# Patient Record
Sex: Female | Born: 1950 | Race: Black or African American | Hispanic: No | Smoking: Never smoker
Health system: Southern US, Community
[De-identification: ages and names within clinical notes are randomized; demographics above are authoritative.]

## PROBLEM LIST (undated history)

## (undated) DIAGNOSIS — Z1231 Encounter for screening mammogram for malignant neoplasm of breast: Secondary | ICD-10-CM

## (undated) DIAGNOSIS — I10 Essential (primary) hypertension: Secondary | ICD-10-CM

## (undated) DIAGNOSIS — Z01812 Encounter for preprocedural laboratory examination: Secondary | ICD-10-CM

## (undated) DIAGNOSIS — Z1211 Encounter for screening for malignant neoplasm of colon: Secondary | ICD-10-CM

## (undated) HISTORY — PX: ABDOMINAL HYSTERECTOMY: SHX81

---

## 1998-05-09 ENCOUNTER — Ambulatory Visit (HOSPITAL_BASED_OUTPATIENT_CLINIC_OR_DEPARTMENT_OTHER): Admission: RE | Admit: 1998-05-09 | Discharge: 1998-05-09 | Payer: Self-pay | Admitting: Orthopaedic Surgery

## 1998-10-05 ENCOUNTER — Encounter: Payer: Self-pay | Admitting: *Deleted

## 1998-10-05 ENCOUNTER — Ambulatory Visit (HOSPITAL_COMMUNITY): Admission: RE | Admit: 1998-10-05 | Discharge: 1998-10-05 | Payer: Self-pay | Admitting: *Deleted

## 1998-10-19 ENCOUNTER — Encounter: Payer: Self-pay | Admitting: *Deleted

## 1998-10-19 ENCOUNTER — Ambulatory Visit (HOSPITAL_COMMUNITY): Admission: RE | Admit: 1998-10-19 | Discharge: 1998-10-19 | Payer: Self-pay | Admitting: *Deleted

## 1999-10-16 ENCOUNTER — Ambulatory Visit (HOSPITAL_COMMUNITY): Admission: RE | Admit: 1999-10-16 | Discharge: 1999-10-16 | Payer: Self-pay | Admitting: *Deleted

## 2000-10-20 ENCOUNTER — Encounter: Payer: Self-pay | Admitting: Obstetrics and Gynecology

## 2000-10-20 ENCOUNTER — Ambulatory Visit (HOSPITAL_COMMUNITY): Admission: RE | Admit: 2000-10-20 | Discharge: 2000-10-20 | Payer: Self-pay | Admitting: Obstetrics and Gynecology

## 2000-10-22 ENCOUNTER — Encounter: Payer: Self-pay | Admitting: Obstetrics and Gynecology

## 2000-10-22 ENCOUNTER — Encounter: Admission: RE | Admit: 2000-10-22 | Discharge: 2000-10-22 | Payer: Self-pay | Admitting: Obstetrics and Gynecology

## 2002-01-13 ENCOUNTER — Ambulatory Visit (HOSPITAL_COMMUNITY): Admission: RE | Admit: 2002-01-13 | Discharge: 2002-01-13 | Payer: Self-pay | Admitting: Obstetrics and Gynecology

## 2002-01-13 ENCOUNTER — Encounter: Payer: Self-pay | Admitting: Obstetrics and Gynecology

## 2002-01-19 ENCOUNTER — Encounter: Admission: RE | Admit: 2002-01-19 | Discharge: 2002-01-19 | Payer: Self-pay | Admitting: Obstetrics and Gynecology

## 2002-01-19 ENCOUNTER — Encounter: Payer: Self-pay | Admitting: Obstetrics and Gynecology

## 2003-05-06 ENCOUNTER — Ambulatory Visit (HOSPITAL_COMMUNITY): Admission: RE | Admit: 2003-05-06 | Discharge: 2003-05-06 | Payer: Self-pay | Admitting: Family Medicine

## 2003-07-27 ENCOUNTER — Encounter: Admission: RE | Admit: 2003-07-27 | Discharge: 2003-07-27 | Payer: Self-pay | Admitting: Obstetrics and Gynecology

## 2004-04-18 ENCOUNTER — Encounter: Admission: RE | Admit: 2004-04-18 | Discharge: 2004-04-18 | Payer: Self-pay | Admitting: Obstetrics and Gynecology

## 2005-03-27 ENCOUNTER — Encounter: Admission: RE | Admit: 2005-03-27 | Discharge: 2005-03-27 | Payer: Self-pay | Admitting: Obstetrics and Gynecology

## 2005-11-08 ENCOUNTER — Emergency Department (HOSPITAL_COMMUNITY): Admission: EM | Admit: 2005-11-08 | Discharge: 2005-11-08 | Payer: Self-pay | Admitting: Family Medicine

## 2005-11-10 ENCOUNTER — Emergency Department (HOSPITAL_COMMUNITY): Admission: EM | Admit: 2005-11-10 | Discharge: 2005-11-10 | Payer: Self-pay | Admitting: Family Medicine

## 2005-11-11 ENCOUNTER — Emergency Department (HOSPITAL_COMMUNITY): Admission: EM | Admit: 2005-11-11 | Discharge: 2005-11-11 | Payer: Self-pay | Admitting: Emergency Medicine

## 2006-01-16 ENCOUNTER — Emergency Department (HOSPITAL_COMMUNITY): Admission: EM | Admit: 2006-01-16 | Discharge: 2006-01-17 | Payer: Self-pay | Admitting: Emergency Medicine

## 2006-01-21 ENCOUNTER — Ambulatory Visit (HOSPITAL_COMMUNITY): Admission: RE | Admit: 2006-01-21 | Discharge: 2006-01-22 | Payer: Self-pay | Admitting: Orthopaedic Surgery

## 2007-10-06 ENCOUNTER — Encounter: Admission: RE | Admit: 2007-10-06 | Discharge: 2007-10-06 | Payer: Self-pay | Admitting: Obstetrics

## 2009-08-18 ENCOUNTER — Encounter: Admission: RE | Admit: 2009-08-18 | Discharge: 2009-08-18 | Payer: Self-pay | Admitting: Anesthesiology

## 2010-07-22 ENCOUNTER — Encounter: Payer: Self-pay | Admitting: Obstetrics

## 2010-07-22 ENCOUNTER — Encounter: Payer: Self-pay | Admitting: Obstetrics and Gynecology

## 2010-11-16 NOTE — Consult Note (Signed)
Melanie Bryant, Melanie Bryant               ACCOUNT NO.:  [id number]   MEDICAL RECORD NO.:  [id number]          PATIENT TYPE:  EMS   LOCATION:  ED                           FACILITY:  [facility]   PHYSICIAN:  Vanita Panda. Magnus Ivan, M.D.DATE OF BIRTH:  1951/02/13   DATE OF CONSULTATION:  01/17/2006  DATE OF DISCHARGE:  01/17/2006                                   CONSULTATION   ORTHOPEDIC CONSULTATION   DATE OF CONSULTATION:  January 17, 2006   REASON FOR CONSULTATION:  Displaced right distal radius fracture.   HISTORY OF PRESENT ILLNESS:  Briefly, Ms. Melanie Bryant is a very pleasant 60-year-  old right hand dominant female who said she was coming down some stairs  wearing her high heels when she accidentally missed the last step and fell  on her outstretched right wrist.  She reported intense pain and deformity of  that wrist and was brought to the ER by a friend.  In the ER she was found  to have a severely displaced distal radius fracture and orthopedic surgery  was consulted.  When I saw her she did report numbness and tingling in her  index and middle fingers that was mild and denied any other injury.  She  denied shoulder pain or elbow pain.  She otherwise reported that she had  intense pain in that wrist only.   PAST MEDICAL HISTORY:  Negative.   ALLERGIES:  No known drug allergies.   MEDICATIONS:  None.   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  She does report that she does not see a  physician regularly and has not seen a physician in some time.   REVIEW OF SYSTEMS:  Negative for chest pain, shortness of breath, fever,  chills, nausea or vomiting.  Negative for syncopal episode.   PHYSICAL EXAMINATION:  VITAL SIGNS:  Her blood pressure is 179/96, heart  rate 70, respiratory rate 16, temperature 98.  GENERAL APPEARANCE:  This is an alert and oriented female in no acute  distress but obvious discomfort.  HEENT:   Normocephalic, atraumatic.  Pupils equal, round, reactive to light.  Extraocular muscles intact.  NECK:  Supple.  She is not tender to palpation throughout her cervical spine  and her thoracic and lumbar spines.  PELVIS:  Stable to AP and lateral compression.  EXTREMITIES:  Extremities of her left upper extremity in its' entirety as  well as her bilateral lower extremities showed no obvious deformities except  for some slight pain in her left ankle.  There is no obvious deformity or  swelling at the ankle and it is stable with motion and compression and  stressing.  There is no palpable deficits in the ankle whatsoever.  Examination of the right upper extremity shows no tenderness to palpation at  the shoulder or elbow.  The wrist shows obvious deformity with likely  dorsally displaced fracture.  The skin is intact.  There is abundant  swelling.  She moves her fingers and thumb but these are all quite painful  to her.  She has subjective decreased sensation in the tips of her index and  middle fingers.  Her pulses in the wrist are palpable and the hand appears  well perfused.   CLINICAL DATA:  X-rays are reviewed and show a dorsally displaced, 100%  displaced distal radius fracture involving incongruence of the distal radial  ulnar joint and likely ulnar styloid piece.  This does appear to be extra-  articular fracture though.   IMPRESSION:  This is a 60 year old status post mechanical fall on  outstretched right hand with a severely displaced distal radius fracture.   PLAN:  In the emergency room, I was able to perform a hematoma block on the  dorsum of her wrist, after prepping with Betadine and alcohol using 1% plain  Lidocaine with approximately 5 cc infiltrated and I did draw back hematoma.  I then performed a reduction maneuver and placed her in finger trap traction  with 10 pounds and then in 5 minutes I went up to 15 pounds and then 5 more  minutes up to 20 pounds.  Clinically,  she appeared to be in a reduced  position and she was more comfortable so she was placed in a well-padded  sugar-tong splint.  Post reduction x-rays are pending.  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  I will see her back in the office as the swelling subsides to  make a better determination about the possibility of her surgery.  I have  explained this to her in detail and she is someone who says that she would  not like to be in casting for six weeks and would need to get back to some  type of work as soon as she could so we will discuss these options further  in the office as well.  She is given followup information as well as pain  medication and discharged with friends.           ______________________________  Vanita Panda. Magnus Ivan, M.D.     CYB/MEDQ  D:  01/17/2006  T:  01/17/2006  Job:  [id number]

## 2010-11-16 NOTE — Op Note (Signed)
Melanie Bryant, Melanie Bryant               ACCOUNT NO.:  [id number]   MEDICAL RECORD NO.:  [id number]          PATIENT TYPE:  OIB   LOCATION:  5017                         FACILITY:  [facility]   PHYSICIAN:  Vanita Panda. Magnus Ivan, M.D.DATE OF BIRTH:  April 19, 1951   DATE OF PROCEDURE:  01/21/2006  DATE OF DISCHARGE:                                 OPERATIVE REPORT   PREOPERATIVE DIAGNOSIS:  Right unstable intra-articular distal radius  fracture.   POSTOPERATIVE DIAGNOSIS:  Right unstable intra-articular distal radius  fracture.   PROCEDURE:  Open reduction and internal fixation of right distal radius  fracture using [employer] volar plate.   SURGEON:  Vanita Panda. Magnus Ivan, M.D.   ANESTHESIA:  General.   ANTIBIOTICS:  1 gram IV Ancef.   TOURNIQUET TIME:  1 hour 53 minutes.   COMPLICATIONS:  None.   INDICATIONS:  Briefly, Melanie Bryant is a 60 year old right-hand-dominant female  who sustained a mechanical fall last week when she missed the last steps at  home.  She fell onto an outstretched right wrist and had an obvious  deformity wrist.  She was seen in the [facility] Emergency Room and found  to have a displaced distal radius fracture that was 100% displaced.  I  performed a closed reduction maneuver and got it as closely reduced as  possible.  She had mild median nerve symptoms in terms of numbness in her  index and long fingers, but otherwise, was when splinting. Due to the  unstable nature of the fracture, I recommended open reduction and internal  fixation.  The risks and benefits of this were explained to her and well  understood.   PROCEDURE:  After informed consent was obtained and the appropriate right  wrist was marked, Melanie Bryant was brought to the operating room and placed  supine on the operating table.  A nonsterile tourniquet was placed around  her upper arm.  General anesthesia was then obtained.  Her arm was prepped  with DuraPrep and sterile drapes.  An Esmarch  was used to wrap out the arm  and the tourniquet was inflated to 250 mmHg.  A volar approach to the wrist  was taken and the skin was cut in an interval between the flexor carpi  radialis and the radial artery was taken.  The pronator quadratus was  identified and both ulnar and radial structures were protected.  The  pronator quadratus was assessed and it was teased off the bone from a radial  to ulnar direction.  The fracture was easily identified and it was found to  have at least four parts of the fracture and was intra-articular.  I was  able to tease all fracture pieces into place, at least on the  ulnar/intermediate column. I then fashioned a standard [employer]  volar plate to the surface and secured it proximally under the slotted screw  hole and then distally on the lunate facette with the pieces held in reduced  position.  This was verified under fluoroscopic guidance.  I then was able  to, as close as possible, tease the radial styloid pieces  into place and  secured the distal radial styloid screw, as well.  I then filled the rest  the screws except for one that was actually in the fracture site.  The plate  was further secured proximally with bicortical screws with a total of three  bicortical screws proximally.  I put the wrist through a range of motion  under fluoroscopy and it found to be ligamentously stable.  The wounds were  then irrigated and I closed the pronator quadratus back over the plate with  interrupted 3-0 Vicryl suture followed by 3-0 Vicryl in the subcutaneous  tissue and 3-0 Prolene in the skin.  A well-padded volar splint was applied  with Xeroform on the incision, dressings and then a volar plaster splint.  The tourniquet was let down, the fingers did pinken nicely.  The patient was  awakened, extubated, and taken to the recovery room in stable condition.  Final counts were correct and there no complications noted.            ______________________________  Vanita Panda. Magnus Ivan, M.D.     CYB/MEDQ  D:  01/21/2006  T:  01/21/2006  Job:  [id number]

## 2010-11-16 NOTE — Consult Note (Signed)
Melanie Bryant, SPRAKER               ACCOUNT NO.:  [id number]   MEDICAL RECORD NO.:  [id number]          PATIENT TYPE:  EMS   LOCATION:  MAJO                         FACILITY:  [facility]   PHYSICIAN:  Jefry H. Pollyann Kennedy, MD     DATE OF BIRTH:  03-Jun-1951   DATE OF CONSULTATION:  11/10/2005  DATE OF DISCHARGE:  11/10/2005                                   CONSULTATION   TIME:  11:45 a.m.   SITE:  [facility]   REASON FOR CONSULTATION:  Epistaxis.   HISTORY:  This is a 60 year old lady in excellent health.  Last Friday  started having nose bleeds from the left side anteriorly and posteriorly.  She had it intermittently for several days and then went to the urgent care  center on Friday.  Initially, they cauterized the left side of her nose with  silver nitrate.  She started bleeding a couple of hours later and came back  to the urgent care center where they placed an inflatable gel packing.  She  was instructed to come back today for removal of the packing and she started  having some bleeding again last night.  Last night when she had bleeding  there was dripping out the front of the nose as well as the posterior nasal  cavity/nasopharynx.   PAST MEDICAL HISTORY:  Negative.   PAST SURGICAL HISTORY:  Negative.   She is on no medications, has no allergies.  The only pertinent history is  that she has a history of long-standing crusty lesion left anterior nasal  cavity that she picked at frequently.   PHYSICAL EXAMINATION:  GENERAL:  Pleasant and healthy-appearing lady in no  distress.  There is an inflatable gel packing in the left nasal cavity.  This was removed easily.  The packing was completely clean except for the  anterior aspect which has some old blood staining.  The oropharynx is clear.  There is no blood draining down the back.  The nasal cavities were inspected  and topical Afrin/Xylocaine was applied.  The cautery site was visualized in  the left anterior  septum.  A couple areas of superficial ulceration were  cauterized again with silver nitrate.  Two additional areas were cauterized,  one in the anterior aspect of the left inferior turbinate where a tiny  amount of bleeding seems to be occurring.  The second place was the floor of  the nose anteriorly at the junction of the floor and the septum, again where  there seemed to be a small superficial vessel.  She tolerated this well.  She is to be observed in the emergency department for one hour and  discharged home if she has no further problems.  She is instructed to not  pick her nose.  She is instructed to use saline spray 10-20 times per day.  She is instructed to follow up with me if there is any additional bleeding  or come to the emergency room.  She is also instructed to use cotton balls  with Afrin packing inside the nose should the bleeding start up  again to see  if this will stop it.     Jefry H. Pollyann Kennedy, MD  Electronically Signed    JHR/MEDQ  D:  11/10/2005  T:  11/11/2005  Job:  [id number]

## 2011-01-12 ENCOUNTER — Encounter: Payer: Self-pay | Admitting: *Deleted

## 2011-01-12 ENCOUNTER — Emergency Department (INDEPENDENT_AMBULATORY_CARE_PROVIDER_SITE_OTHER): Payer: Managed Care, Other (non HMO)

## 2011-01-12 ENCOUNTER — Emergency Department (HOSPITAL_BASED_OUTPATIENT_CLINIC_OR_DEPARTMENT_OTHER)
Admission: EM | Admit: 2011-01-12 | Discharge: 2011-01-12 | Disposition: A | Discharge status: Home / Self Care | Payer: Managed Care, Other (non HMO) | Attending: Emergency Medicine | Admitting: Emergency Medicine

## 2011-01-12 DIAGNOSIS — Z79899 Other long term (current) drug therapy: Secondary | ICD-10-CM | POA: Insufficient documentation

## 2011-01-12 DIAGNOSIS — W010XXA Fall on same level from slipping, tripping and stumbling without subsequent striking against object, initial encounter: Secondary | ICD-10-CM | POA: Insufficient documentation

## 2011-01-12 DIAGNOSIS — Z9889 Other specified postprocedural states: Secondary | ICD-10-CM | POA: Insufficient documentation

## 2011-01-12 DIAGNOSIS — M79609 Pain in unspecified limb: Secondary | ICD-10-CM

## 2011-01-12 DIAGNOSIS — S9030XA Contusion of unspecified foot, initial encounter: Secondary | ICD-10-CM | POA: Insufficient documentation

## 2011-01-12 DIAGNOSIS — Y93E1 Activity, personal bathing and showering: Secondary | ICD-10-CM | POA: Insufficient documentation

## 2011-01-12 DIAGNOSIS — S93609A Unspecified sprain of unspecified foot, initial encounter: Secondary | ICD-10-CM | POA: Insufficient documentation

## 2011-01-12 DIAGNOSIS — I10 Essential (primary) hypertension: Secondary | ICD-10-CM | POA: Insufficient documentation

## 2011-01-12 DIAGNOSIS — M25579 Pain in unspecified ankle and joints of unspecified foot: Secondary | ICD-10-CM | POA: Insufficient documentation

## 2011-01-12 HISTORY — DX: Essential (primary) hypertension: I10

## 2011-01-12 MED ORDER — IBUPROFEN 400 MG PO TABS
600.0000 mg | ORAL_TABLET | Freq: Once | ORAL | Status: AC
  Administered 2011-01-12: 600 mg via ORAL
  Filled 2011-01-12: qty 1

## 2011-01-12 MED ORDER — IBUPROFEN 600 MG PO TABS: 600.0000 mg | ORAL_TABLET | Freq: Three times a day (TID) | ORAL | Status: AC | PRN

## 2011-01-12 NOTE — ED Notes (Signed)
Pt says that when she got out of the bath tub last night she slipped. She c/o pain in her right foot.

## 2011-01-12 NOTE — ED Provider Notes (Signed)
History    the patient is a 60 year old female who reports that when getting out of the bathtub last night she slipped on the floor and fell injuring her right foot. She reports pain localized to the right first metatarsophalangeal joint and the right great toe. The pain is worse with palpation, weightbearing, walking. The pain is better with elevation, ice. She denies any pain to the ankle to the knee or hip and denies any other injury from her slip and fall. She denies numbness or weakness distal to the injury at her foot.  Chief Complaint  Patient presents with  . Foot Pain   Patient is a 60 y.o. female presenting with lower extremity pain. The history is provided by the patient.  Foot Pain This is a new problem. The current episode started yesterday. The problem occurs constantly. The problem has been gradually worsening. Pertinent negatives include no chest pain and no shortness of breath. The symptoms are aggravated by walking and standing. The symptoms are relieved by ice. She has tried a cold compress for the symptoms. The treatment provided mild relief.    Past Medical History  Diagnosis Date  . Hypertension     Past Surgical History  Procedure Date  . Abdominal hysterectomy     No family history on file.  History  Substance Use Topics  . Smoking status: Never Smoker   . Smokeless tobacco: Not on file  . Alcohol Use: Yes     occasional    OB History    Grav Para Term Preterm Abortions TAB SAB Ect Mult Living                  Review of Systems  Constitutional: Negative.   HENT: Negative for neck pain and neck stiffness.   Respiratory: Negative for shortness of breath.   Cardiovascular: Negative for chest pain.  Musculoskeletal: Positive for joint swelling. Negative for back pain.  Skin: Negative.   Neurological: Negative for numbness.    Physical Exam  BP 146/87  Pulse 69  Temp(Src) 98.6 F (37 C) (Oral)  Resp 16  Ht 5\' 6"  (1.676 m)  Wt 175 lb (79.379  kg)  BMI 28.25 kg/m2  SpO2 99%  Physical Exam  Nursing note and vitals reviewed. Constitutional: She is oriented to person, place, and time. She appears well-nourished. No distress.  HENT:  Head: Normocephalic.  Eyes: EOM are normal. Pupils are equal, round, and reactive to light.  Neck: Normal range of motion. Neck supple.  Cardiovascular: Normal rate and regular rhythm.   Pulmonary/Chest: Effort normal.  Musculoskeletal: She exhibits tenderness. She exhibits no edema.       Right foot: She exhibits tenderness and bony tenderness. She exhibits normal range of motion, no swelling, no deformity and no laceration.       Feet:  Neurological: She is alert and oriented to person, place, and time. No cranial nerve deficit.  Skin: Skin is warm and dry. No rash noted.  Psychiatric: She has a normal mood and affect.    ED Course  Procedures  MDM Differential diagnosis includes fracture, sprain, contusion. After reviewing the x-ray images as well as the radiologist's interpretation of these images no apparent fracture is seen. The patient has apparent contusion and strain of the foot only.      Felisa Bonier, MD 01/12/11 1120

## 2011-07-22 ENCOUNTER — Other Ambulatory Visit: Payer: Self-pay | Admitting: Internal Medicine

## 2011-07-22 DIAGNOSIS — Z1231 Encounter for screening mammogram for malignant neoplasm of breast: Secondary | ICD-10-CM

## 2011-08-08 ENCOUNTER — Ambulatory Visit
Admission: RE | Admit: 2011-08-08 | Discharge: 2011-08-08 | Disposition: A | Discharge status: Home / Self Care | Payer: Managed Care, Other (non HMO) | Source: Ambulatory Visit | Attending: Internal Medicine | Admitting: Internal Medicine

## 2011-08-08 DIAGNOSIS — Z1231 Encounter for screening mammogram for malignant neoplasm of breast: Secondary | ICD-10-CM

## 2012-02-03 ENCOUNTER — Emergency Department (HOSPITAL_COMMUNITY)
Admission: EM | Admit: 2012-02-03 | Discharge: 2012-02-04 | Disposition: A | Discharge status: Home / Self Care | Payer: Managed Care, Other (non HMO) | Attending: Emergency Medicine | Admitting: Emergency Medicine

## 2012-02-03 ENCOUNTER — Encounter (HOSPITAL_COMMUNITY): Payer: Self-pay | Admitting: Emergency Medicine

## 2012-02-03 DIAGNOSIS — W230XXA Caught, crushed, jammed, or pinched between moving objects, initial encounter: Secondary | ICD-10-CM | POA: Insufficient documentation

## 2012-02-03 DIAGNOSIS — M79646 Pain in unspecified finger(s): Secondary | ICD-10-CM

## 2012-02-03 DIAGNOSIS — S6990XA Unspecified injury of unspecified wrist, hand and finger(s), initial encounter: Secondary | ICD-10-CM | POA: Insufficient documentation

## 2012-02-03 MED ORDER — BACITRACIN ZINC 500 UNIT/GM EX OINT
TOPICAL_OINTMENT | CUTANEOUS | Status: AC
  Administered 2012-02-03
  Filled 2012-02-03: qty 0.9

## 2012-02-03 MED ORDER — ACETAMINOPHEN 325 MG PO TABS
650.0000 mg | ORAL_TABLET | Freq: Once | ORAL | Status: AC
  Administered 2012-02-03: 650 mg via ORAL
  Filled 2012-02-03: qty 2

## 2012-02-03 MED ORDER — HYDROCODONE-ACETAMINOPHEN 5-325 MG PO TABS: ORAL_TABLET | ORAL | Status: AC

## 2012-02-03 NOTE — ED Provider Notes (Signed)
History     CSN: [id number]  Arrival date & time 02/03/12  2127   First MD Initiated Contact with Patient 02/03/12 2314      Chief Complaint  Patient presents with  . Finger Injury    (Consider location/radiation/quality/duration/timing/severity/associated sxs/prior treatment) HPI Comments: R 2nd and 3rd fingertip pain after catching in riding lawn mower. She was wearing fake nails which shattered. 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 States 2nd finger is throbbing. Onset acute. Course is constant. Nothing makes pain better or worse.   Patient is a 61 y.o. female presenting with hand injury. The history is provided by the patient.  Hand Injury  The incident occurred 12 to 24 hours ago. The incident occurred at home. The pain is present in the right fingers. The quality of the pain is described as throbbing. The pain is moderate. The pain has been constant since the incident. She reports no foreign bodies present. She has tried nothing for the symptoms.    Past Medical History  Diagnosis Date  . Hypertension     Past Surgical History  Procedure Date  . Abdominal hysterectomy     No family history on file.  History  Substance Use Topics  . Smoking status: Never Smoker   . Smokeless tobacco: Not on file  . Alcohol Use: Yes     occasional    OB History    Grav Para Term Preterm Abortions TAB SAB Ect Mult Living                  Review of Systems  Constitutional: Negative for activity change.  Musculoskeletal: Negative for joint swelling and arthralgias.  Skin: Positive for wound.  Neurological: Negative for weakness and numbness.    Allergies  Review of patient's allergies indicates no known allergies.  Home Medications   Current Outpatient Rx  Name Route Sig Dispense Refill  . TELMISARTAN-HCTZ 80-25 MG PO TABS Oral Take 1 tablet by mouth daily.      Marland Kitchen ZOLPIDEM TARTRATE 5 MG PO TABS Oral Take 5 mg by mouth  at bedtime as needed. Insomnia      BP 145/74  Pulse 55  Temp 99 F (37.2 C)  SpO2 100%  Physical Exam  Nursing note and vitals reviewed. Constitutional: She appears well-developed and well-nourished.  HENT:  Head: Normocephalic and atraumatic.  Eyes: Pupils are equal, round, and reactive to light.  Neck: Normal range of motion. Neck supple.  Cardiovascular: Exam reveals no decreased pulses.   Musculoskeletal: She exhibits tenderness. She exhibits no edema.       Right hand: She exhibits tenderness. She exhibits normal range of motion, normal capillary refill, no deformity and no laceration. normal sensation noted. Normal strength noted.       Hands: Neurological: She is alert. No sensory deficit.       Motor, sensation, and vascular distal to the injury is fully intact.   Skin: Skin is warm and dry.  Psychiatric: She has a normal mood and affect.    ED Course  Procedures (including critical care time)  Labs Reviewed - No data to display No results found.   1. Finger pain     11:47 PM Patient seen and examined.    Vital signs reviewed and are as follows: Filed Vitals:   02/03/12 2146  BP: 145/74  Pulse: 55  Temp: 99 F (37.2 C)   The patient was urged to return  to the Emergency Department urgently with worsening pain, swelling, expanding erythema especially if it streaks away from the affected area, fever, or if they have any other concerns. Patient verbalized understanding.   Counseled on wound care.   Patient counseled on use of narcotic pain medications. Counseled not to combine these medications with others containing tylenol. Urged not to drink alcohol, drive, or perform any other activities that requires focus while taking these medications. The patient verbalizes understanding and agrees with the plan.    MDM  Fingertip injury. No laceration or avulsion. Full ROM in fingers. Cannot rule-out tuft fracture but doubt this. Conservative management is  indicated. No tenosynovitis.         Renne Crigler, [place] 02/04/12 2150

## 2012-02-05 NOTE — ED Provider Notes (Signed)
Medical screening examination/treatment/procedure(s) were performed by non-physician practitioner and as supervising physician I was immediately available for consultation/collaboration.   Hanley Seamen, MD 02/05/12 [phone]

## 2012-09-13 IMAGING — CR DG FOOT COMPLETE 3+V*R*
3 series · 3 of 3 positions shown · non-contrast
Comparison: None

CLINICAL DATA: Foot pain

RIGHT FOOT COMPLETE - 3+ VIEW

[t foot ap right]
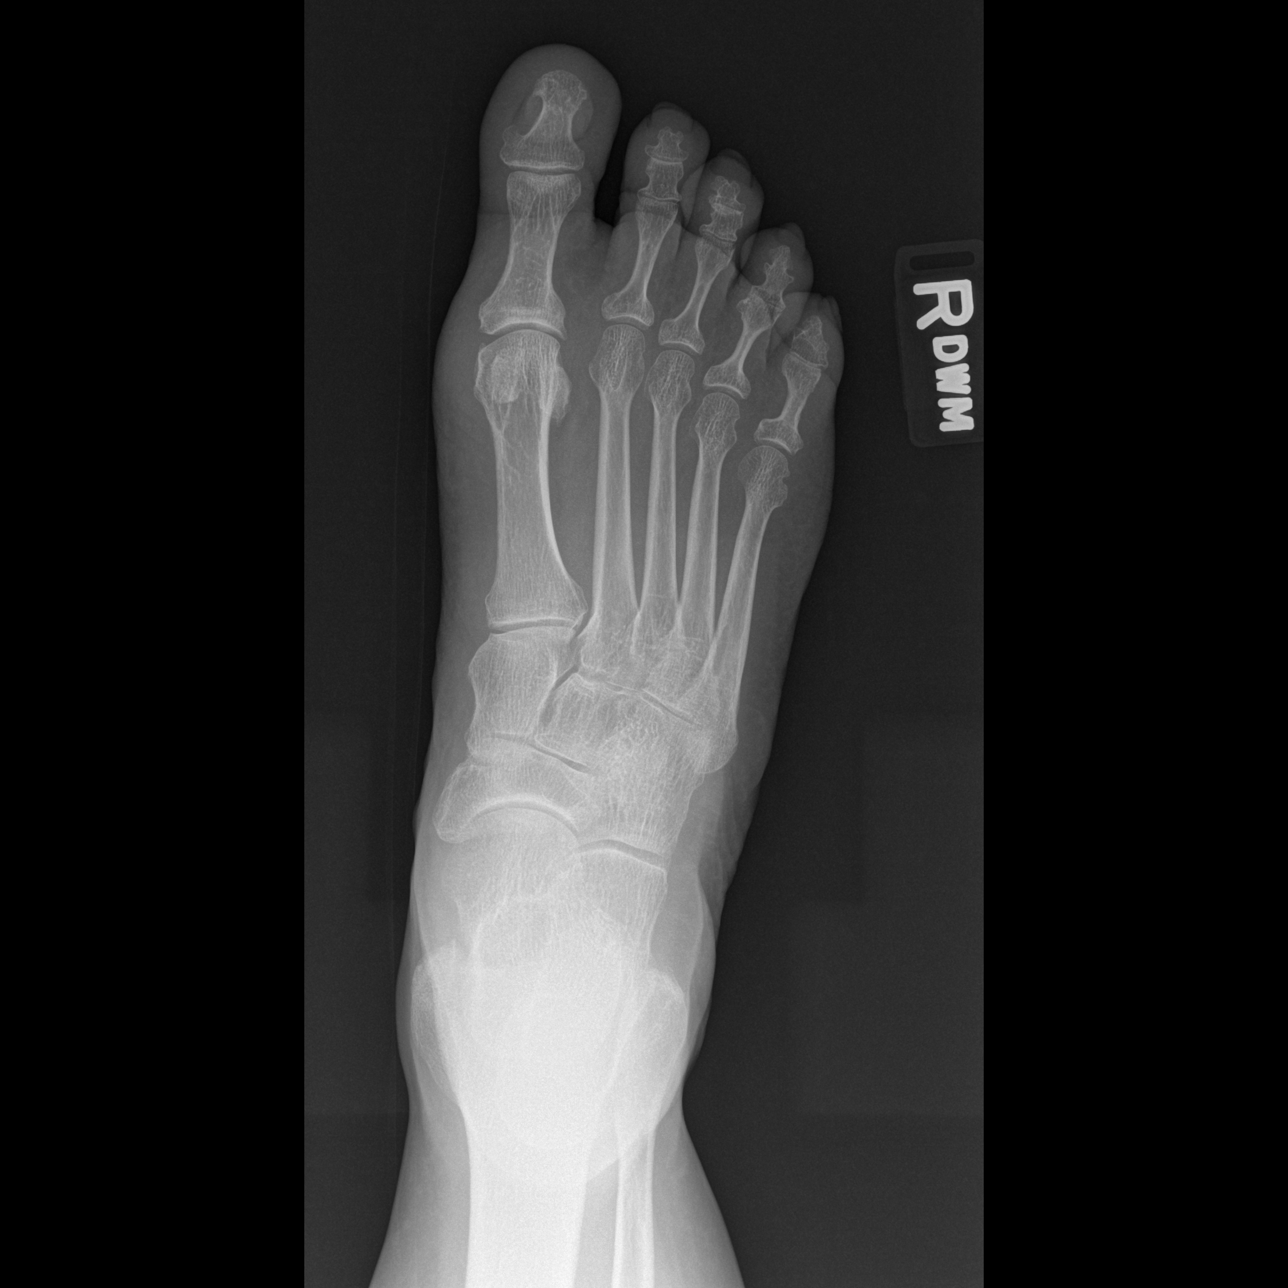

[t foot oblique right]
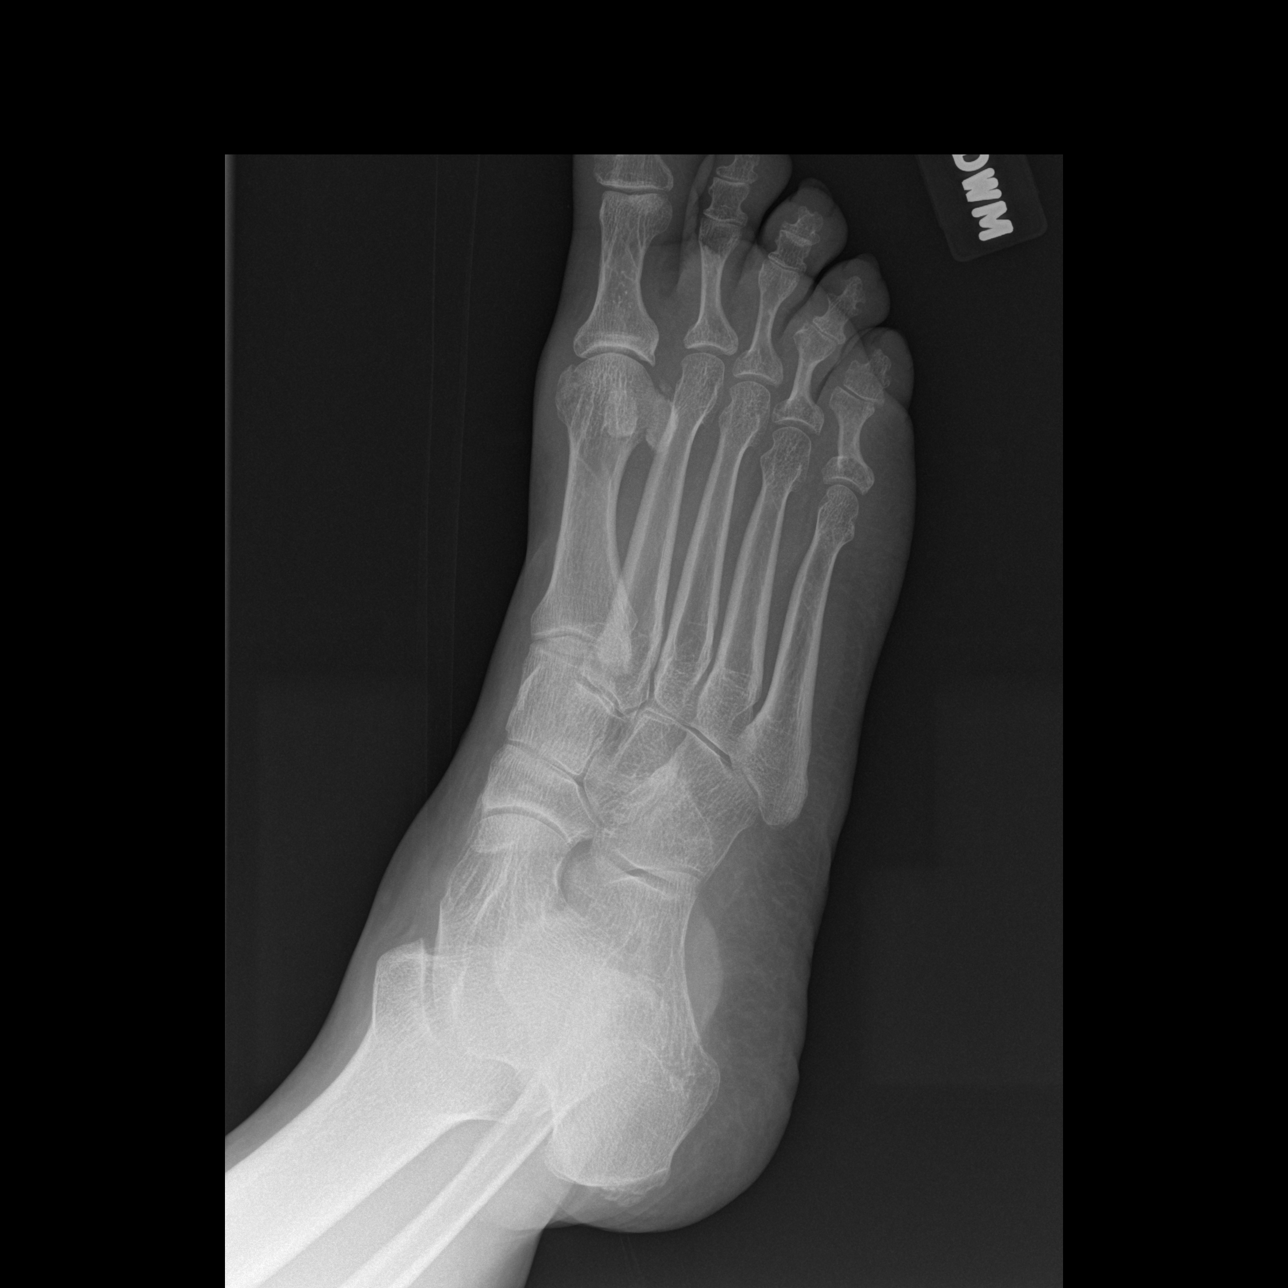

[t foot lat right]
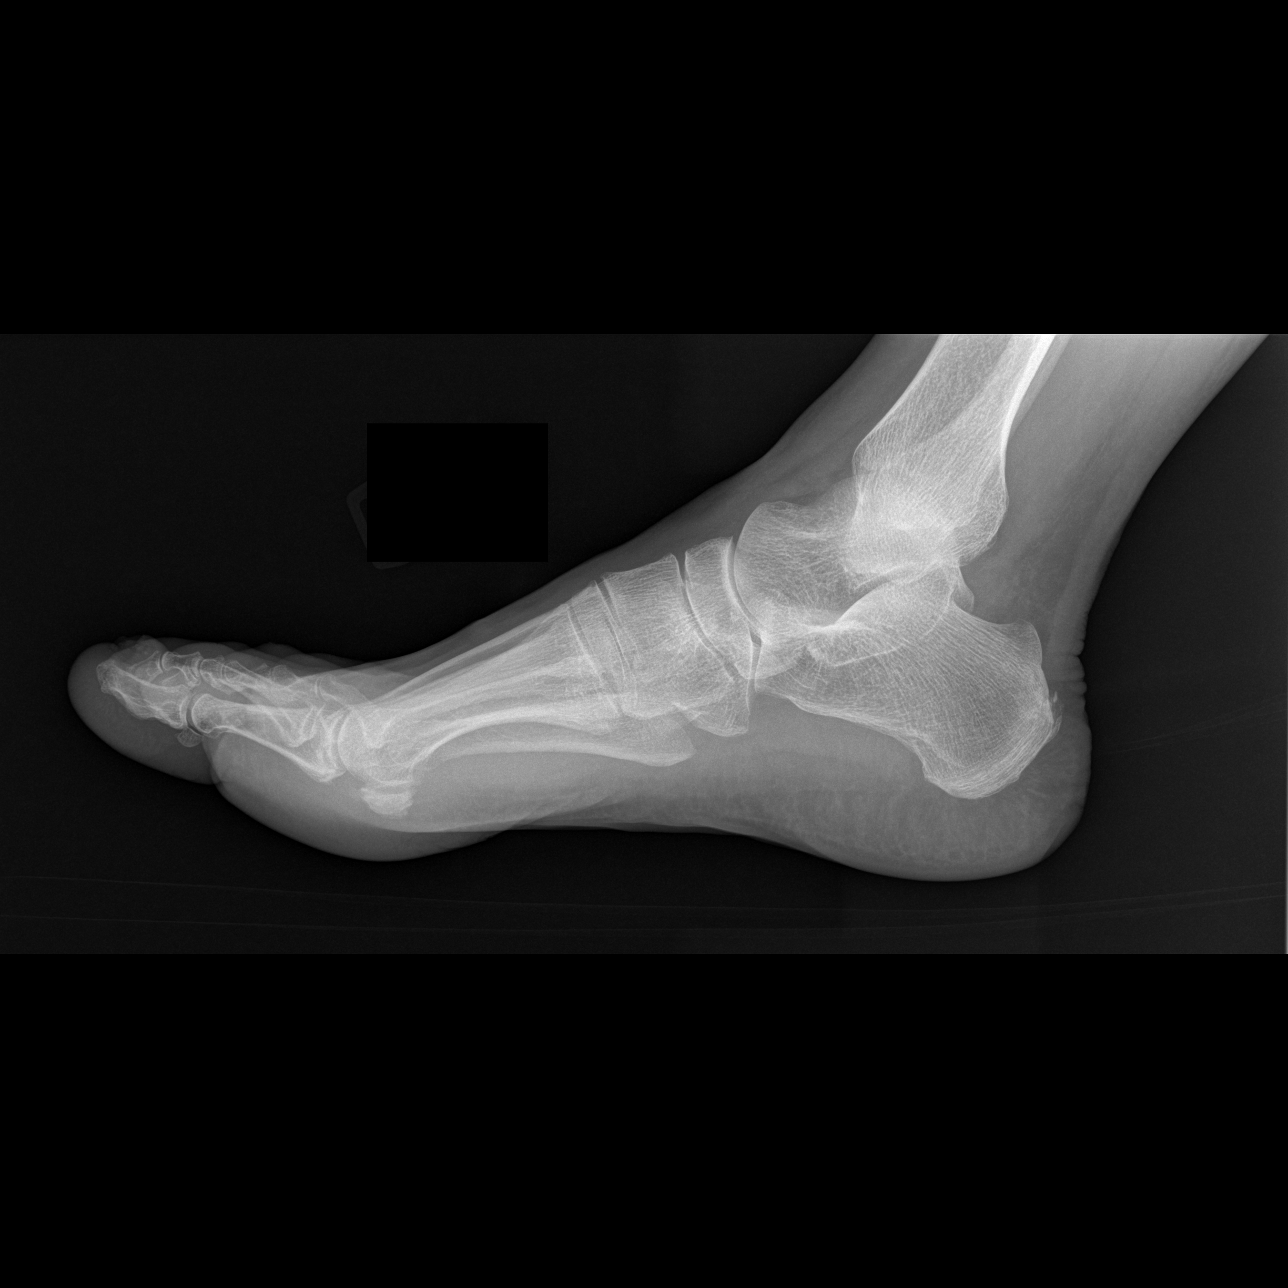

[3 of 3 positions shown; findings below may reference images not displayed]

FINDINGS: There is no evidence of fracture or dislocation.  There
is no evidence of arthropathy or other focal bone abnormality.
Soft tissues are unremarkable.
IMPRESSION: No acute findings.

## 2013-09-22 ENCOUNTER — Other Ambulatory Visit: Payer: Self-pay

## 2013-09-22 DIAGNOSIS — Z1231 Encounter for screening mammogram for malignant neoplasm of breast: Secondary | ICD-10-CM

## 2013-10-20 ENCOUNTER — Ambulatory Visit: Payer: Managed Care, Other (non HMO)

## 2013-10-27 ENCOUNTER — Ambulatory Visit: Payer: Managed Care, Other (non HMO)

## 2013-11-08 ENCOUNTER — Ambulatory Visit: Payer: Managed Care, Other (non HMO)

## 2014-02-04 ENCOUNTER — Ambulatory Visit
Admission: RE | Admit: 2014-02-04 | Discharge: 2014-02-04 | Disposition: A | Discharge status: Home / Self Care | Payer: Managed Care, Other (non HMO) | Source: Ambulatory Visit

## 2014-02-04 ENCOUNTER — Encounter (INDEPENDENT_AMBULATORY_CARE_PROVIDER_SITE_OTHER): Payer: Self-pay

## 2014-02-04 DIAGNOSIS — Z1231 Encounter for screening mammogram for malignant neoplasm of breast: Secondary | ICD-10-CM

## 2014-07-06 ENCOUNTER — Other Ambulatory Visit: Payer: Self-pay

## 2014-07-06 DIAGNOSIS — Z1231 Encounter for screening mammogram for malignant neoplasm of breast: Secondary | ICD-10-CM

## 2015-01-25 ENCOUNTER — Encounter (HOSPITAL_COMMUNITY): Payer: Self-pay | Admitting: Family Medicine

## 2015-01-25 ENCOUNTER — Emergency Department (HOSPITAL_COMMUNITY)
Admission: EM | Admit: 2015-01-25 | Discharge: 2015-01-25 | Disposition: A | Discharge status: Home / Self Care | Payer: 59 | Attending: Emergency Medicine | Admitting: Emergency Medicine

## 2015-01-25 DIAGNOSIS — Z79899 Other long term (current) drug therapy: Secondary | ICD-10-CM | POA: Diagnosis not present

## 2015-01-25 DIAGNOSIS — R531 Weakness: Secondary | ICD-10-CM | POA: Insufficient documentation

## 2015-01-25 DIAGNOSIS — I10 Essential (primary) hypertension: Secondary | ICD-10-CM | POA: Diagnosis not present

## 2015-01-25 DIAGNOSIS — R42 Dizziness and giddiness: Secondary | ICD-10-CM | POA: Diagnosis present

## 2015-01-25 LAB — CBC
HCT: 42.1 % (ref 36.0–46.0)
HEMOGLOBIN: 14.5 g/dL (ref 12.0–15.0)
MCH: 30.4 pg (ref 26.0–34.0)
MCHC: 34.4 g/dL (ref 30.0–36.0)
MCV: 88.3 fL (ref 78.0–100.0)
Platelets: 257 10*3/uL (ref 150–400)
RBC: 4.77 MIL/uL (ref 3.87–5.11)
RDW: 12.6 % (ref 11.5–15.5)
WBC: 6.6 10*3/uL (ref 4.0–10.5)

## 2015-01-25 LAB — URINALYSIS, ROUTINE W REFLEX MICROSCOPIC
Bilirubin Urine: NEGATIVE
GLUCOSE, UA: NEGATIVE mg/dL
KETONES UR: NEGATIVE mg/dL
Nitrite: NEGATIVE
PROTEIN: NEGATIVE mg/dL
SPECIFIC GRAVITY, URINE: 1.01 (ref 1.005–1.030)
UROBILINOGEN UA: 0.2 mg/dL (ref 0.0–1.0)
pH: 5 (ref 5.0–8.0)

## 2015-01-25 LAB — BASIC METABOLIC PANEL
Anion gap: 8 (ref 5–15)
BUN: 17 mg/dL (ref 6–20)
CHLORIDE: 105 mmol/L (ref 101–111)
CO2: 25 mmol/L (ref 22–32)
Calcium: 9.7 mg/dL (ref 8.9–10.3)
Creatinine, Ser: 0.74 mg/dL (ref 0.44–1.00)
GFR calc Af Amer: >60 mL/min (ref >60)
GFR calc non Af Amer: >60 mL/min (ref >60)
GLUCOSE: 103 mg/dL — AB (ref 65–99)
Potassium: 3.6 mmol/L (ref 3.5–5.1)
Sodium: 138 mmol/L (ref 135–145)

## 2015-01-25 LAB — URINE MICROSCOPIC-ADD ON

## 2015-01-25 LAB — CBG MONITORING, ED: GLUCOSE-CAPILLARY: 76 mg/dL (ref 65–99)

## 2015-01-25 NOTE — ED Notes (Addendum)
Pt sts she woke up this am with weakness and dizziness. sts after drinking some soda and honey she felt better.

## 2015-01-25 NOTE — ED Notes (Signed)
Provided meal, Dr. Fritzi Mandes allows.

## 2015-01-25 NOTE — ED Notes (Signed)
Pt refused CT scan

## 2015-01-25 NOTE — ED Provider Notes (Signed)
CSN: [id number]     Arrival date & time 01/25/15  1412 History   First MD Initiated Contact with Patient 01/25/15 1628     Chief Complaint  Patient presents with  . Weakness  . Dizziness   (Consider location/radiation/quality/duration/timing/severity/associated sxs/prior Treatment) HPI Patient is a 64 year old female with a history of hypertension presented today for dizziness and weakness. Patient reports she has had intermittent headaches over the last several days with some fatigue last night. This morning she got up and felt slightly dizzy and weak. Subsequently drinks some honey and felt mildly better. Denies any palpitations, chest pain, shortness of breath, nausea, vomiting. She reports some mild dizziness with getting up.  Reports that she was asymptomatic at this time but wanted to be evaluated. She does admit to mild right hand numbness. This was self-limited and reports that she has had surgery in that hand before and has intermittent numbness.  Past Medical History  Diagnosis Date  . Hypertension    Past Surgical History  Procedure Laterality Date  . Abdominal hysterectomy     History reviewed. No pertinent family history. History  Substance Use Topics  . Smoking status: Never Smoker   . Smokeless tobacco: Not on file  . Alcohol Use: Yes     Comment: occasional   OB History    No data available     Review of Systems  Constitutional: Positive for fatigue. Negative for fever and chills.  HENT: Negative for congestion and sore throat.   Eyes: Negative for pain.  Respiratory: Negative for cough and shortness of breath.   Cardiovascular: Negative for chest pain and palpitations.  Gastrointestinal: Negative for nausea, vomiting, abdominal pain and diarrhea.  Genitourinary: Negative for dysuria and flank pain.  Musculoskeletal: Negative for back pain and neck pain.  Skin: Negative for rash.  Allergic/Immunologic: Negative.   Neurological: Positive for dizziness and  weakness (generalized). Negative for seizures, syncope, facial asymmetry, speech difficulty, light-headedness, numbness and headaches.  Psychiatric/Behavioral: Negative for confusion.    Allergies  Review of patient's allergies indicates no known allergies.  Home Medications   Prior to Admission medications   Medication Sig Start Date End Date Taking? Authorizing Provider  telmisartan-hydrochlorothiazide (MICARDIS HCT) 80-25 MG per tablet Take 1 tablet by mouth daily.     Yes Historical Provider, MD  zolpidem (AMBIEN) 5 MG tablet Take 2.5 mg by mouth at bedtime as needed for sleep. Insomnia   Yes Historical Provider, MD   BP 156/79 mmHg  Pulse 57  Temp(Src) 97.9 F (36.6 C) (Oral)  Resp 15  SpO2 98% Physical Exam  Constitutional: She is oriented to person, place, and time. She appears well-developed and well-nourished. No distress.  HENT:  Head: Normocephalic and atraumatic.  Eyes: Conjunctivae and EOM are normal. Pupils are equal, round, and reactive to light.  Neck: Normal range of motion. Neck supple.  Cardiovascular: Normal rate, regular rhythm and normal heart sounds.   Pulses:      Radial pulses are 2+ on the right side, and 2+ on the left side.  Pulmonary/Chest: Effort normal and breath sounds normal. No respiratory distress. She has no decreased breath sounds. She has no wheezes.  Abdominal: Soft. Bowel sounds are normal. There is no tenderness.  Musculoskeletal: Normal range of motion.  Neurological: She is alert and oriented to person, place, and time. She has normal strength and normal reflexes. She displays normal reflexes. No cranial nerve deficit or sensory deficit. She displays a negative Romberg sign. GCS eye subscore  is 4. GCS verbal subscore is 5. GCS motor subscore is 6.  Normal finger to nose bilaterally.  Rapid alternating movements intact bilaterally.  Normal heal to shin bilaterally.   No pronator drift bilaterally.    Skin: Skin is warm and dry. She is  not diaphoretic.  Psychiatric: She has a normal mood and affect.    ED Course  Procedures (including critical care time) Labs Review Labs Reviewed  BASIC METABOLIC PANEL - Abnormal; Notable for the following:    Glucose, Bld 103 (*)    All other components within normal limits  URINALYSIS, ROUTINE W REFLEX MICROSCOPIC (NOT AT [facility]) - Abnormal; Notable for the following:    APPearance CLOUDY (*)    Hgb urine dipstick MODERATE (*)    Leukocytes, UA SMALL (*)    All other components within normal limits  URINE MICROSCOPIC-ADD ON - Abnormal; Notable for the following:    Squamous Epithelial / LPF MANY (*)    All other components within normal limits  CBC  CBG MONITORING, ED    Imaging Review No results found.   EKG Interpretation   Date/Time:  Wednesday January 25 2015 15:22:10 EDT Ventricular Rate:  67 PR Interval:  174 QRS Duration: 88 QT Interval:  400 QTC Calculation: 422 R Axis:   17 Text Interpretation:  Normal sinus rhythm Normal ECG No significant change  since last tracing Reconfirmed by KNAPP  MD-J, JON ([id number]) on 01/26/2015  3:50:49 PM      MDM   Final diagnoses:  Dizziness   DDX infection, anemia, stroke, TIA, ACS, arrhythmia, hypoglycemia.   On initial evaluation patient was hemodynamically stable and in no acute distress. Symptoms resolved.  CBC showed no anemia or leukocytosis. BMP with no acute metabolic derangements and glucose of 103.  Neurologic exam with no focal deficits at this time. No continued numbness of the right hand. Ordered CT head but patient refused. Doubt TIA or stroke.  EKG with no acute ischemic changes, arrhythmia, arrhythmogenic potential. No signs of Brugada.  Pt able to ambulate easily with no difficulty in the emergency department.  Continued to be asymptomatic.  Advised to f/u with PCP in 3-5 days and given strict return precautions.   If performed, labs, EKGs, and imaging were reviewed/interpreted by myself and my attending and  incorporated into medical decision making.  Discussed pertinent finding with patient or caregiver prior to discharge with no further questions.  Immediate return precautions given and pt or caregiver reports understanding.  Pt care supervised by my attending Dr. Lynelle Doctor.   Tery Sanfilippo, MD PGY-2  Emergency Medicine     Tery Sanfilippo, MD 01/27/15 1512  Linwood Dibbles, MD 01/28/15 [phone]

## 2015-01-25 NOTE — ED Notes (Signed)
CBG 76 

## 2015-01-25 NOTE — Discharge Instructions (Signed)

## 2015-02-08 ENCOUNTER — Ambulatory Visit: Payer: Managed Care, Other (non HMO)

## 2015-08-23 ENCOUNTER — Other Ambulatory Visit: Payer: Self-pay

## 2015-08-23 DIAGNOSIS — Z1231 Encounter for screening mammogram for malignant neoplasm of breast: Secondary | ICD-10-CM

## 2015-09-13 ENCOUNTER — Ambulatory Visit
Admission: RE | Admit: 2015-09-13 | Discharge: 2015-09-13 | Disposition: A | Discharge status: Home / Self Care | Payer: BLUE CROSS/BLUE SHIELD | Source: Ambulatory Visit

## 2015-09-13 DIAGNOSIS — Z1231 Encounter for screening mammogram for malignant neoplasm of breast: Secondary | ICD-10-CM

## 2017-12-08 ENCOUNTER — Encounter

## 2017-12-08 ENCOUNTER — Inpatient Hospital Stay: Admit: 2017-12-08 | Payer: MEDICARE | Attending: Family Medicine | Primary: Family Medicine

## 2017-12-08 DIAGNOSIS — Z1231 Encounter for screening mammogram for malignant neoplasm of breast: Secondary | ICD-10-CM

## 2020-03-15 ENCOUNTER — Encounter

## 2020-05-05 ENCOUNTER — Inpatient Hospital Stay: Admit: 2020-05-05 | Payer: MEDICARE | Attending: Family Medicine | Primary: Family Medicine

## 2020-05-05 DIAGNOSIS — Z1231 Encounter for screening mammogram for malignant neoplasm of breast: Secondary | ICD-10-CM

## 2020-05-14 NOTE — Interval H&P Note (Signed)
Informed patient of COVID requirements, patient to complete COVID curbside testing at [facility] Friday, November 19th between 0700-1500. Patient verbalized understanding that COVID test is required to proceed with procedure.

## 2020-05-19 ENCOUNTER — Encounter

## 2020-05-19 ENCOUNTER — Inpatient Hospital Stay: Admit: 2020-05-19 | Payer: MEDICARE | Attending: Gastroenterology | Primary: Family Medicine

## 2020-05-19 DIAGNOSIS — Z01812 Encounter for preprocedural laboratory examination: Secondary | ICD-10-CM

## 2020-05-20 LAB — SARS-COV-2: SARS-CoV-2: NOT DETECTED

## 2020-05-20 LAB — COVID-19: SARS-CoV-2: NOT DETECTED

## 2020-05-22 NOTE — Interval H&P Note (Signed)
 [facility].  [facility]  Endoscopy Preprocedure Instructions      1. On the day of your surgery, please report to registration located on the 2nd floor of the  the [facility]. yes    2. You must have a responsible adult to drive you to the hospital, stay at the hospital during your procedure and drive you home. If they leave your procedure will not be started (no exceptions). yes    3. Do not have anything to eat or drink (including water, gum, mints, coffee, and juice) after midnight. This does not apply to the medications you were instructed to take by your physician.yes  If you are currently taking Plavix, Coumadin, Aspirin, or other blood-thinning agents, contact your physician for special instructions. yes    4. If you are having a procedure that requires bowel prep: We recommend that you have only clear liquids the day before your procedure and begin your bowel prep by 5:00 pm.  You may continue to drink clear liquids until midnight.  If for any reason you are not able to complete your prep please notify your physician immediately. yes    5. Have a list of all current medications, including vitamins, herbal supplements and any other over the counter medications. yes    6. If you wear glasses, contacts, dentures and/or hearing aids, they may be removed prior to procedure, please bring a case to store them in. yes    7. You should understand that if you do not follow these instructions your procedure may be cancelled.  If your physical condition changes (I.e. fever, cold or flu) please contact your doctor as soon as possible.    8. It is important that you be on time.  If for any reason you are unable to keep your appointment please call [phone] the day of or your physician's office prior to your scheduled procedure    9. Have you received your COVID Vaccine? yes If no, you will need to receive a COVID test/swab here at [facility] the MOB parking lot Monday - Friday 8a - 11am. There are no Saturday  or Sunday swabbing at any [facility] facility. (patient verbalizes understanding) yes    Pt verbalizes understanding that her driver must remain present for the entire duration of her appt.

## 2020-05-23 ENCOUNTER — Inpatient Hospital Stay: Payer: MEDICARE

## 2020-05-23 MED ORDER — LIDOCAINE (PF) 20 MG/ML (2 %) IJ SOLN
20 mg/mL (2 %) | INTRAMUSCULAR | Status: DC | PRN
Start: 2020-05-23 — End: 2020-05-23
  Administered 2020-05-23: 14:00:00 via INTRAVENOUS

## 2020-05-23 MED ORDER — PROPOFOL 10 MG/ML IV EMUL
10 mg/mL | INTRAVENOUS | Status: AC
Start: 2020-05-23 — End: ?

## 2020-05-23 MED ORDER — SIMETHICONE 40 MG/0.6 ML ORAL DROPS, SUSP
40 mg/0.6 mL | ORAL | Status: DC | PRN
Start: 2020-05-23 — End: 2020-05-23

## 2020-05-23 MED ORDER — FLUMAZENIL 0.1 MG/ML IV SOLN
0.1 mg/mL | INTRAVENOUS | Status: DC | PRN
Start: 2020-05-23 — End: 2020-05-23

## 2020-05-23 MED ORDER — EPINEPHRINE 0.1 MG/ML SYRINGE
0.1 mg/mL | Freq: Once | INTRAMUSCULAR | Status: DC | PRN
Start: 2020-05-23 — End: 2020-05-23

## 2020-05-23 MED ORDER — MIDAZOLAM 1 MG/ML IJ SOLN
1 mg/mL | INTRAMUSCULAR | Status: DC | PRN
Start: 2020-05-23 — End: 2020-05-23

## 2020-05-23 MED ORDER — ATROPINE 0.1 MG/ML SYRINGE
0.1 mg/mL | Freq: Once | INTRAMUSCULAR | Status: DC | PRN
Start: 2020-05-23 — End: 2020-05-23

## 2020-05-23 MED ORDER — PROPOFOL 10 MG/ML IV EMUL
10 mg/mL | INTRAVENOUS | Status: DC | PRN
Start: 2020-05-23 — End: 2020-05-23
  Administered 2020-05-23 (×3): via INTRAVENOUS

## 2020-05-23 MED ORDER — SODIUM CHLORIDE 0.9 % IV
INTRAVENOUS | Status: DC | PRN
Start: 2020-05-23 — End: 2020-05-23
  Administered 2020-05-23: 14:00:00 via INTRAVENOUS

## 2020-05-23 MED ORDER — SODIUM CHLORIDE 0.9 % IV
INTRAVENOUS | Status: DC
Start: 2020-05-23 — End: 2020-05-23

## 2020-05-23 MED ORDER — NALOXONE 0.4 MG/ML INJECTION
0.4 mg/mL | INTRAMUSCULAR | Status: DC | PRN
Start: 2020-05-23 — End: 2020-05-23

## 2020-05-23 MED ORDER — LIDOCAINE (PF) 20 MG/ML (2 %) IV SYRINGE
100 mg/5 mL (2 %) | INTRAVENOUS | Status: AC
Start: 2020-05-23 — End: ?

## 2020-05-23 MED FILL — PROPOFOL 10 MG/ML IV EMUL: 10 mg/mL | INTRAVENOUS | Qty: 20

## 2020-05-23 MED FILL — LIDOCAINE (PF) 20 MG/ML (2 %) IV SYRINGE: 100 mg/5 mL (2 %) | INTRAVENOUS | Qty: 10

## 2020-05-23 MED FILL — PROPOFOL 10 MG/ML IV EMUL: 10 mg/mL | INTRAVENOUS | Qty: 100

## 2020-05-23 MED FILL — SODIUM CHLORIDE 0.9 % IV: INTRAVENOUS | Qty: 250

## 2020-05-23 NOTE — H&P (Signed)
H&P by Marisa Hua, MD at  05/23/20 [phone]                Author: Marisa Hua, MD  Service: Gastroenterology  Author Type: Physician       Filed: 05/23/20 0834  Date of Service: 05/23/20 0833  Status: Signed          Editor: Marisa Hua, MD (Physician)               [place] - [facility]   Marisa Hua, M.D.   [phone]                   History and Physical           NAME:  Annette Simpson     DOB:   06-Jul-1950    MRN:   [id number]          Referring Physician:  Richrd Sox, MD         Consult Date: 05/23/2020  8:33 AM      Chief Complaint:  Colon cancer screening      History of Present Illness:  Patient is a  69 y.o. who is seen for colon cancer screening. Denies any ongoing GI complaints.         PMH:     Past Medical History:        Diagnosis  Date         ?  Hypertension             PSH:     Past Surgical History:         Procedure  Laterality  Date          ?  HX HYSTERECTOMY         ?  HX ORTHOPAEDIC  Right            wrist           Allergies:   No Known Allergies      Home Medications:     Prior to Admission Medications     Prescriptions  Last Dose  Informant  Patient Reported?  Taking?      Iron BisGl &PS Cmp-C-B12-FA-Ca 150-60-25-1 mg-mg-mcg-mg cap  05/22/2020 at Unknown time    Yes  Yes      Sig: Take  by mouth.      ascorbic acid, vitamin C, (Vitamin C) 250 mg tablet  05/22/2020 at Unknown time    Yes  Yes      Sig: Take  by mouth.      cholecalciferol, vitamin D3, (Vitamin D3) 50 mcg (2,000 unit) tab  05/22/2020 at Unknown time    Yes  Yes      Sig: Take  by mouth.      potassium chloride (KLOR-CON M10) 10 mEq tablet  05/22/2020 at Unknown time    Yes  Yes      Sig: Take 10 mEq by mouth.      telmisartan-hydroCHLOROthiazide (MICARDIS HCT) 80-25 mg per tablet  05/23/2020 at Unknown time    Yes  Yes      Sig: Take 1 Tablet by mouth daily.      zolpidem (AMBIEN) 10 mg tablet  05/21/2020    Yes  Yes      Sig: Take 5 mg by mouth nightly as needed for Sleep.                Facility-Administered Medications: None  Hospital Medications:     No current facility-administered medications for this encounter.           Social History:     Social History          Tobacco Use         ?  Smoking status:  Former Smoker              Quit date:  1992         Years since quitting:  29.9         ?  Smokeless tobacco:  Never Used       Substance Use Topics         ?  Alcohol use:  Yes              Alcohol/week:  1.0 standard drink         Types:  1 Glasses of wine per week             Comment: 1-2 /week           Family History:   History reviewed. No pertinent family history.               Review of Systems:         Constitutional: negative fever, negative chills, negative weight loss   Eyes:   negative visual changes   ENT:   negative sore throat, tongue or lip swelling   Respiratory:  negative cough, negative dyspnea   Cards:  negative for chest pain, palpitations, lower extremity edema   GI:   See HPI   GU:  negative for frequency, dysuria   Integument:  negative for rash and pruritus   Heme:  negative for easy bruising and gum/nose bleeding   Musculoskel: negative for myalgias,  back pain and muscle weakness   Neuro: negative for headaches, dizziness, vertigo   Psych:  negative for feelings of anxiety, depression            Objective:        Patient Vitals for the past 8 hrs:              BP  Temp  Pulse  Resp  SpO2  Height  Weight              05/23/20 0751  (!) 141/71  98.4 ??F (36.9 ??C)  69  13  100 %  5' 6.5" (1.689 m)  70.6 kg (155 lb 10.3 oz)              05/23/20 0744  --  --  --  --  --  5' 6.5" (1.689 m)  70.6 kg (155 lb 10.3 oz)        No intake/output data recorded.   No intake/output data recorded.      EXAM:      NEURO-a&o    HEENT-wnl    LUNGS-clear     COR-regular rate and rhythym      ABD-soft , no tenderness, no rebound, good bs      EXT-no edema       Data Review       No results for input(s): WBC, HGB, HCT, PLT, HGBEXT, HCTEXT, PLTEXT in the last 72 hours.   No results  for input(s): NA, K, CL, CO2, BUN, CREA, GLU, PHOS, CA in the last 72 hours.   No results for input(s): AP, TBIL, TP, ALB, GLOB, GGT, AML, LPSE in the last 72 hours.  No lab exists for component: SGOT, GPT, AMYP, HLPSE   No results for input(s): INR, PTP, APTT, INREXT in the last 72 hours.      There is no problem list on file for this patient.         Assessment:     ??    Colon cancer screening       Plan:     ??    Colonoscopy today.           Signed By:  Marisa Hua, MD        05/23/2020  8:33 AM

## 2020-05-23 NOTE — Progress Notes (Signed)
Annette Simpson  1951/07/01  [id number]    Situation:  Verbal report received from: Crystal RN  Procedure: Procedure(s):  COLONOSCOPY  ENDOSCOPIC POLYPECTOMY    Background:    Preoperative diagnosis: COLON SCREENING  Postoperative diagnosis: ascending colon polyp  diverticulosis    Operator:  Dr. Ruffin Frederick  Assistant(s): Endoscopy RN-1: Ardis Hughs, RN  Endoscopy RN-2: Serita Kyle, RN    Specimens:   ID Type Source Tests Collected by Time Destination   1 : ascending colon polyp Preservative Colon, Ascending  Marisa Hua, MD 05/23/2020 [phone] Pathology     H. Pylori  no    Assessment:  Intra-procedure medications   Anesthesia gave intra-procedure sedation and medications, see anesthesia flow sheet yes    Intravenous fluids: NS@ KVO     Vital signs stable yes    Abdominal assessment: round and soft yes    Recommendation:  Discharge patient per MD order yes.  Family or Friend Albin Felling  Permission to share finding with family or friend yes

## 2020-05-23 NOTE — Progress Notes (Signed)
Endoscopy discharge instructions have been reviewed and given to patient.  The patient verbalized understanding and acceptance of instructions.      Dr. Khalid discussed with patient procedure findings and next steps.

## 2020-05-23 NOTE — Interval H&P Note (Signed)
Patient was scheduled for a EGD today. Patient has a loose tooth and is concerned with it being removed by the scope or bite block used for EGD. Patient spoke with Dr. Micheline Rough, anesthesiologist, and was made aware of the risk that can happen with a loose tooth. Patient has decided not to have the EGD done today, will reschedule after she gets her tooth fixed.

## 2020-05-23 NOTE — Procedures (Signed)
Procedures by  Cleda Clarks, MD at 05/23/20 0093                Author: Cleda Clarks, MD  Service: Gastroenterology  Author Type: Physician       Filed: 05/23/20 0919  Date of Service: 05/23/20 0913  Status: Signed          Editor: Cleda Clarks, MD (Physician)            Pre-procedure Diagnoses        1. Colon cancer screening [Z12.11]                           Post-procedure Diagnoses        1. Adenomatous polyp of ascending colon [D12.2]        2. Diverticulosis of large intestine without diverticulitis [K57.30]                           Procedures        1. [name] [[id number]]                              [place] [facility]   Cleda Clarks, M.D.   [phone]                05/23/2020            Colonoscopy Operative Report   Annette Simpson   DOB:  04/03/1951   [facility] Medical Record Number:  [id number]         Indications:    Screening colonoscopy       Operator:  Cleda Clarks, MD      Assistant -- None   Implants -- None      Referring Provider: Zachery Dakins, MD      Sedation:  MAC anesthesia Propofol      Pre-Procedural Exam:        Airway: clear,  No airway problems anticipated   Heart: RRR, without gallops or rubs   Lungs: clear bilaterally without wheezes, crackles, or rhonchi   Abdomen: soft, nontender, nondistended, bowel sounds present   Mental Status: awake, alert and oriented to person, place and time       Procedure Details:  After informed consent was obtained with all risks and benefits of procedure explained and preoperative exam completed,  the patient was taken to the endoscopy suite and placed in the left lateral decubitus position.  Upon sequential sedation as per above, a digital rectal exam was performed. The Olympus videocolonoscope  was inserted in the rectum and carefully advanced  to the terminal ileum.  The quality of preparation was good.  The colonoscope was slowly withdrawn with careful inspection and evaluation between  folds. Retroflexion  in the rectum was performed.      Findings:    Terminal Ileum: normal   Cecum: normal   Ascending Colon: 1  Sessile polyp(s), the largest 8 mm in size;   Transverse Colon: normal   Descending Colon: normal   Sigmoid:     - Diverticulosis   Rectum: normal      Interventions:  1 complete polypectomy were performed using cold snare  and the polyps were  retrieved      Specimen Removed:  specimen #1, 8 mm in size, located in the ascending colon removed by cold snare and retrieved for pathology  Complications: None.       EBL:  None.      Impression:  1 polyp removed as above                          Mild sigmoid diverticulosis      Recommendations:   -Await pathology.   -If adenoma is present, repeat colonoscopy in 5 years.    -High fiber diet.     -Resume normal medication(s).       Discharge Disposition:  Home in the company of a driver when able to ambulate.      Cleda Clarks, MD   05/23/2020  9:13 AM

## 2020-05-23 NOTE — Interval H&P Note (Signed)
Received Report From [facility]  @ 4628, see anesthesia notes.    Care of the patient transferred to procedure nurse C.Hawkins, RN @ [phone]    Out of Procedure and sent to post-recovery @ 0915    Post-recovery report given to Dixon Boos, RN @ [phone]    Patient ABD remains soft and non-tender post procedure.  Pt has no complaints at this time and tolerated the procedure well.     Endoscope was pre-cleaned at bedside immediately following procedure by Kriste Basque, RN.

## 2020-05-23 NOTE — Interval H&P Note (Signed)
Patient spoke with Dr. Ruffin Frederick about postponing the EGD of her procedure. She explained she was not comfortable proceeding with the procedure having a loose tooth. Dr. Ruffin Frederick explained to the patient that her CT scan showed some thickening of her esophagus. He explained that there could be a list of things of why her esophagus shows some thickening, including but not limited to GERD or Esophageal Cancer. Dr. Ruffin Frederick explained to the patient that it was imperative that she rescheduled her EGD as soon as possible after she gets her tooth fixed. Patient verbalized her understanding.

## 2020-05-23 NOTE — Interval H&P Note (Signed)
Initial RN admission and assessment performed and documented in Endoscopy navigator.     Patient evaluated by anesthesia in pre-procedure holding.    All procedural vital signs, airway assessment, and level of consciousness information monitored and recorded by anesthesia staff on the anesthesia record.

## 2020-05-23 NOTE — Anesthesia Post-Procedure Evaluation (Signed)
Procedure(s):  COLONOSCOPY  ENDOSCOPIC POLYPECTOMY.    MAC    Anesthesia Post Evaluation      Multimodal analgesia: multimodal analgesia used between 6 hours prior to anesthesia start to PACU discharge  Patient location during evaluation: bedside  Patient participation: complete - patient participated  Level of consciousness: awake  Pain management: adequate  Airway patency: patent  Anesthetic complications: no  Cardiovascular status: acceptable  Respiratory status: acceptable  Hydration status: acceptable        INITIAL Post-op Vital signs:   Vitals Value Taken Time   BP 167/86 05/23/20 0936   Temp     Pulse 63 05/23/20 0937   Resp 15 05/23/20 0937   SpO2 100 % 05/23/20 0937   Vitals shown include unvalidated device data.

## 2020-05-23 NOTE — Anesthesia Pre-Procedure Evaluation (Signed)
Anesthesia Preprocedure Evaluation by Danella Sensing, MD at 05/23/20 [phone]                Author: Danella Sensing, MD  Service: ANESTHESIOLOGY  Author Type: Anesthesiologist       Filed: 05/23/20 0814  Date of Service: 05/23/20 0810  Status: Signed          Editor: Danella Sensing, MD (Anesthesiologist)                 Relevant Problems       No relevant active problems           Anesthetic History    No history of anesthetic complications                 Review of Systems / Medical History   Patient summary reviewed and pertinent labs reviewed          Pulmonary   Within defined limits                          Neuro/Psych    Within defined limits                 Cardiovascular      Hypertension:  well controlled                     Exercise tolerance: >4 METS            GI/Hepatic/Renal             Renal disease:  stones           Endo/Other   Within defined limits                 Other Findings                     Physical Exam          Airway   Mallampati: III   TM Distance: 4 - 6 cm   Neck ROM: normal range of motion    Mouth opening: Normal        Cardiovascular      Rhythm: regular   Rate: normal              Dental      Dentition: Loose teeth              Pulmonary   Breath sounds clear to auscultation                       Abdominal              Other Findings                  Anesthetic Plan      ASA: 2   Anesthesia type: MAC               Induction: Intravenous   Anesthetic plan and risks discussed with: Patient         Very loose bottom tooth, discussed that we would be very careful but given bite block placement loosing the tooth is a significant  risk. Patient understands.

## 2021-06-13 ENCOUNTER — Encounter

## 2021-10-20 ENCOUNTER — Encounter
